# Patient Record
Sex: Male | Born: 2005
Health system: Southern US, Community
[De-identification: ages and names within clinical notes are randomized; demographics above are authoritative.]

---
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
## 2023-04-06 DIAGNOSIS — Z130 Encounter for screening for diseases of the blood and blood-forming organs and certain disorders involving the immune mechanism: Secondary | ICD-10-CM | POA: Diagnosis not present

## 2023-10-06 ENCOUNTER — Ambulatory Visit
Admission: RE | Admit: 2023-10-06 | Discharge: 2023-10-06 | Disposition: A | Discharge status: Home / Self Care | Payer: Self-pay | Source: Ambulatory Visit | Attending: Internal Medicine | Admitting: Internal Medicine

## 2023-10-06 ENCOUNTER — Other Ambulatory Visit: Payer: Self-pay

## 2023-10-06 VITALS — BP 118/79 | HR 89 | Temp 98.0°F | Resp 18

## 2023-10-06 DIAGNOSIS — Z202 Contact with and (suspected) exposure to infections with a predominantly sexual mode of transmission: Secondary | ICD-10-CM | POA: Diagnosis present

## 2023-10-06 DIAGNOSIS — Z113 Encounter for screening for infections with a predominantly sexual mode of transmission: Secondary | ICD-10-CM | POA: Diagnosis present

## 2023-10-06 MED ORDER — DOXYCYCLINE HYCLATE 100 MG PO CAPS: 100.0000 mg | ORAL_CAPSULE | Freq: Two times a day (BID) | ORAL | 0 refills | Status: AC

## 2023-10-06 NOTE — Discharge Instructions (Signed)
 Penile swab to test for STDs is pending.  I have sent you doxycycline given chlamydia exposure.

## 2023-10-06 NOTE — ED Triage Notes (Signed)
 Pt here for STD testing -swab only. Asymptomatic, but pt notes partner tested positive for chlamydia last week.

## 2023-10-06 NOTE — ED Provider Notes (Signed)
 [facility]    CSN: [id number] Arrival date & time: 10/06/23  1839      History   Chief Complaint Chief Complaint  Patient presents with   Exposure to STD    Entered by patient    HPI Christopher Love is a 18 y.o. male.   Patient presents today for STD testing after an exposure to chlamydia with a recent sexual partner approximately 3 weeks ago.  Patient denies any symptoms.   Exposure to STD    History reviewed. No pertinent past medical history.  There are no active problems to display for this patient.   History reviewed. No pertinent surgical history.     Home Medications    Prior to Admission medications   Medication Sig Start Date End Date Taking? Authorizing Provider  doxycycline (VIBRAMYCIN) 100 MG capsule Take 1 capsule (100 mg total) by mouth 2 (two) times daily for 7 days. 10/06/23 10/13/23 Yes Gustavus Bryant, FNP    Family History History reviewed. No pertinent family history.  Social History Social History   Tobacco Use   Smoking status: Never   Smokeless tobacco: Never  Vaping Use   Vaping status: Never Used  Substance Use Topics   Alcohol use: Never   Drug use: Never     Allergies   Patient has no known allergies.   Review of Systems Review of Systems Per HPI  Physical Exam Triage Vital Signs ED Triage Vitals  Encounter Vitals Group     BP 10/06/23 1854 118/79     Systolic BP Percentile --      Diastolic BP Percentile --      Pulse Rate 10/06/23 1854 89     Resp 10/06/23 1854 18     Temp 10/06/23 1854 98 F (36.7 C)     Temp Source 10/06/23 1854 Oral     SpO2 10/06/23 1854 98 %     Weight --      Height --      Head Circumference --      Peak Flow --      Pain Score 10/06/23 1855 0     Pain Loc --      Pain Education --      Exclude from Growth Chart --    No data found.  Updated Vital Signs BP 118/79 (BP Location: Left Arm)   Pulse 89   Temp 98 F (36.7 C) (Oral)   Resp 18   SpO2 98%   Visual  Acuity Right Eye Distance:   Left Eye Distance:   Bilateral Distance:    Right Eye Near:   Left Eye Near:    Bilateral Near:     Physical Exam Constitutional:      General: He is not in acute distress.    Appearance: Normal appearance. He is not toxic-appearing or diaphoretic.  HENT:     Head: Normocephalic and atraumatic.  Eyes:     Extraocular Movements: Extraocular movements intact.     Conjunctiva/sclera: Conjunctivae normal.  Pulmonary:     Effort: Pulmonary effort is normal.  Genitourinary:    Comments: Deferred with shared decision making.  Self swab performed. Neurological:     General: No focal deficit present.     Mental Status: He is alert and oriented to person, place, and time. Mental status is at baseline.  Psychiatric:        Mood and Affect: Mood normal.        Behavior: Behavior normal.  Thought Content: Thought content normal.        Judgment: Judgment normal.      UC Treatments / Results  Labs (all labs ordered are listed, but only abnormal results are displayed) Labs Reviewed  CYTOLOGY, (ORAL, ANAL, URETHRAL) ANCILLARY ONLY    EKG   Radiology No results found.  Procedures Procedures (including critical care time)  Medications Ordered in UC Medications - No data to display  Initial Impression / Assessment and Plan / UC Course  I have reviewed the triage vital signs and the nursing notes.  Pertinent labs & imaging results that were available during my care of the patient were reviewed by me and considered in my medical decision making (see chart for details).     Cytology swab pending.  Given confirmed exposure, will treat with doxycycline while awaiting results.  Advised safe sex practices and strict return precautions.  Patient verbalized understanding and was agreeable with plan. Final Clinical Impressions(s) / UC Diagnoses   Final diagnoses:  Screening examination for venereal disease  Exposure to chlamydia     Discharge  Instructions      Penile swab to test for STDs is pending.  I have sent you doxycycline given chlamydia exposure.    ED Prescriptions     Medication Sig Dispense Auth. Provider   doxycycline (VIBRAMYCIN) 100 MG capsule Take 1 capsule (100 mg total) by mouth 2 (two) times daily for 7 days. 14 capsule [place], Acie Fredrickson, [place]      PDMP not reviewed this encounter.   Gustavus Bryant, [place] 10/06/23 [phone]

## 2023-10-09 LAB — CYTOLOGY, (ORAL, ANAL, URETHRAL) ANCILLARY ONLY
Chlamydia: POSITIVE — AB
Comment: NEGATIVE
Comment: NEGATIVE
Comment: NORMAL
Neisseria Gonorrhea: NEGATIVE
Trichomonas: NEGATIVE

## 2023-10-11 ENCOUNTER — Other Ambulatory Visit: Payer: Self-pay

## 2023-10-11 ENCOUNTER — Ambulatory Visit
Admission: RE | Admit: 2023-10-11 | Discharge: 2023-10-11 | Disposition: A | Discharge status: Home / Self Care | Source: Ambulatory Visit | Attending: Physician Assistant | Admitting: Physician Assistant

## 2023-10-11 VITALS — BP 143/72 | HR 56 | Temp 98.7°F | Resp 18 | Ht 75.0 in | Wt 175.0 lb

## 2023-10-11 DIAGNOSIS — R112 Nausea with vomiting, unspecified: Secondary | ICD-10-CM | POA: Diagnosis not present

## 2023-10-11 DIAGNOSIS — R1084 Generalized abdominal pain: Secondary | ICD-10-CM

## 2023-10-11 MED ORDER — ONDANSETRON 4 MG PO TBDP: 4.0000 mg | ORAL_TABLET | Freq: Three times a day (TID) | ORAL | 0 refills | Status: AC | PRN

## 2023-10-11 NOTE — Discharge Instructions (Addendum)
 At this time I suspect that your nausea vomiting is likely secondary to your medication use.  Doxycycline is noted to have severe nausea and vomiting associated especially if taking on an empty stomach.  For now I recommend focusing on increasing your fluid intake and improving your nausea today.  If you are able to tolerate food and liquids without issue by the end of the day you can try taking your doxycycline tomorrow.  Try to take this in the middle of the meal to help reduce GI upset.  You can also try things such as Tums, Pepto-Bismol, Pepcid to assist with acid reflux and upset stomach.  I have sent in a medication called Zofran for you to take to assist with nausea and vomiting.  You can take this up to every 8 hours as needed.  If you are still unable to take the doxycycline despite eating and drinking and using the Zofran you can return to urgent care and we can discuss alternative medications.  If at any point you start to develop fever, choking, difficulty drinking and eating to the point refill you are becoming dehydrated please go to the emergency room for further evaluation and management. Please refrain from sexual activity until you have completed the doxycycline course.  Please use a condom or another barrier method to help prevent sexual transmitted disease transmission.

## 2023-10-11 NOTE — ED Triage Notes (Signed)
 Pt presents with complaints of nausea and vomiting since starting new antibiotic, Doxycyline, yesterday. Pt reports taking one dose of this medication. This is the first time taking. Pt currently rates his overall abdominal pain a 6/10. Denies taking OTC medications for symptoms reported.

## 2023-10-11 NOTE — ED Provider Notes (Signed)
 [facility]    CSN: [id number] Arrival date & time: 10/11/23  1147      History   Chief Complaint Chief Complaint  Patient presents with   Nausea    after the pills i've been throwing up non stop - Entered by patient   Emesis    HPI Christopher Love Christopher Love is a 18 y.o. male.   HPI  He reports he was exposed to STD and was treated with Doxycycline (testing results reviewed- positive for chlamydia on 10/06/23) He states he took his first dose yesterday AM and about 30 minutes later started to vomit He reports taking Doxycycline on empty stomach yesterday    He states he has been having recurrent vomiting since trying to take medication He has been able to tolerate drinking gatorade but has thrown up everything else   He denies hemoptysis    History reviewed. No pertinent past medical history.  There are no active problems to display for this patient.   History reviewed. No pertinent surgical history.     Home Medications    Prior to Admission medications   Medication Sig Start Date End Date Taking? Authorizing Provider  doxycycline (VIBRAMYCIN) 100 MG capsule Take 1 capsule (100 mg total) by mouth 2 (two) times daily for 7 days. 10/06/23 10/13/23 Yes [place], Acie Fredrickson, FNP  ondansetron (ZOFRAN-ODT) 4 MG disintegrating tablet Take 1 tablet (4 mg total) by mouth every 8 (eight) hours as needed for nausea or vomiting. 10/11/23  Yes Ceciley Buist, Oswaldo Conroy, PA-C    Family History History reviewed. No pertinent family history.  Social History Social History   Tobacco Use   Smoking status: Never   Smokeless tobacco: Never  Vaping Use   Vaping status: Never Used  Substance Use Topics   Alcohol use: Never   Drug use: Never     Allergies   Patient has no known allergies.   Review of Systems Review of Systems  Constitutional:  Negative for chills and fever.  Respiratory:  Negative for cough, choking and shortness of breath.   Gastrointestinal:  Positive for nausea  and vomiting.     Physical Exam Triage Vital Signs ED Triage Vitals  Encounter Vitals Group     BP 10/11/23 1159 (!) 143/72     Systolic BP Percentile --      Diastolic BP Percentile --      Pulse Rate 10/11/23 1159 (!) 56     Resp 10/11/23 1159 18     Temp 10/11/23 1159 98.7 F (37.1 C)     Temp Source 10/11/23 1159 Oral     SpO2 10/11/23 1159 98 %     Weight 10/11/23 1158 175 lb (79.4 kg)     Height 10/11/23 1158 6\' 3"  (1.905 m)     Head Circumference --      Peak Flow --      Pain Score 10/11/23 1158 6     Pain Loc --      Pain Education --      Exclude from Growth Chart --    No data found.  Updated Vital Signs BP (!) 143/72 (BP Location: Right Arm)   Pulse (!) 56   Temp 98.7 F (37.1 C) (Oral)   Resp 18   Ht 6\' 3"  (1.905 m)   Wt 175 lb (79.4 kg)   SpO2 98%   BMI 21.87 kg/m   Visual Acuity Right Eye Distance:   Left Eye Distance:   Bilateral Distance:  Right Eye Near:   Left Eye Near:    Bilateral Near:     Physical Exam Vitals reviewed.  Constitutional:      General: He is awake.     Appearance: Normal appearance. He is well-developed and well-groomed.  HENT:     Head: Normocephalic and atraumatic.     Mouth/Throat:     Lips: Pink.     Mouth: Mucous membranes are moist.  Pulmonary:     Effort: Pulmonary effort is normal.  Abdominal:     General: Abdomen is flat. Bowel sounds are normal.     Palpations: Abdomen is soft.     Tenderness: There is generalized abdominal tenderness.  Musculoskeletal:     Cervical back: Normal range of motion.  Neurological:     Mental Status: He is alert and oriented to person, place, and time.     GCS: GCS eye subscore is 4. GCS verbal subscore is 5. GCS motor subscore is 6.  Psychiatric:        Attention and Perception: Attention and perception normal.        Mood and Affect: Mood and affect normal.        Speech: Speech normal.        Behavior: Behavior normal. Behavior is cooperative.      UC  Treatments / Results  Labs (all labs ordered are listed, but only abnormal results are displayed) Labs Reviewed - No data to display  EKG   Radiology No results found.  Procedures Procedures (including critical care time)  Medications Ordered in UC Medications - No data to display  Initial Impression / Assessment and Plan / UC Course  I have reviewed the triage vital signs and the nursing notes.  Pertinent labs & imaging results that were available during my care of the patient were reviewed by me and considered in my medical decision making (see chart for details).      Final Clinical Impressions(s) / UC Diagnoses   Final diagnoses:  Nausea and vomiting, unspecified vomiting type  Generalized abdominal discomfort   Presents today with concerns for ongoing nausea and vomiting.  He reports trying to start a doxycycline regimen for chlamydia treatment yesterday and developed vomiting that has been persistent even today. He reports that he tried taking doxycycline without eating anything previously.  Reviewed that doxycycline is notoriously rough on the stomach and symptoms can likely be improved with taking with food and plenty of water.  At this time recommend over-the-counter medications such as Pepcid, Pepto-Bismol, Tums to assist with reflux and GI upset.  Will also send in Zofran to assist with nausea and vomiting.  Recommend bland diet as tolerated and increased hydration efforts throughout the day.  If he is able to tolerate p.o. intake without issue he can try restarting doxycycline course tomorrow.  Reviewed that if he is not able to tolerate doxycycline course we may have to explore alternative measures for which she would need to return to urgent care.  ED and return precautions reviewed and provided after visit summary.  Recommend refraining from sexual activity until he has completed an appropriate medication course.  Recommend using a condom or another barrier method to  help prevent sexually transmitted diseases.  Follow-up as needed for progressing or persistent symptoms    Discharge Instructions      At this time I suspect that your nausea vomiting is likely secondary to your medication use.  Doxycycline is noted to have severe nausea and vomiting associated  especially if taking on an empty stomach.  For now I recommend focusing on increasing your fluid intake and improving your nausea today.  If you are able to tolerate food and liquids without issue by the end of the day you can try taking your doxycycline tomorrow.  Try to take this in the middle of the meal to help reduce GI upset.  You can also try things such as Tums, Pepto-Bismol, Pepcid to assist with acid reflux and upset stomach.  I have sent in a medication called Zofran for you to take to assist with nausea and vomiting.  You can take this up to every 8 hours as needed.  If you are still unable to take the doxycycline despite eating and drinking and using the Zofran you can return to urgent care and we can discuss alternative medications.  If at any point you start to develop fever, choking, difficulty drinking and eating to the point refill you are becoming dehydrated please go to the emergency room for further evaluation and management. Please refrain from sexual activity until you have completed the doxycycline course.  Please use a condom or another barrier method to help prevent sexual transmitted disease transmission.     ED Prescriptions     Medication Sig Dispense Auth. Provider   ondansetron (ZOFRAN-ODT) 4 MG disintegrating tablet Take 1 tablet (4 mg total) by mouth every 8 (eight) hours as needed for nausea or vomiting. 20 tablet Santiago Graf E, PA-C      PDMP not reviewed this encounter.   Providence Crosby, PA-C 10/11/23 1217

## 2023-10-15 ENCOUNTER — Other Ambulatory Visit: Payer: Self-pay

## 2023-10-15 ENCOUNTER — Emergency Department (HOSPITAL_COMMUNITY)

## 2023-10-15 ENCOUNTER — Emergency Department (HOSPITAL_COMMUNITY)
Admission: EM | Admit: 2023-10-15 | Discharge: 2023-10-15 | Disposition: A | Discharge status: Home / Self Care | Attending: Emergency Medicine | Admitting: Emergency Medicine

## 2023-10-15 ENCOUNTER — Encounter (HOSPITAL_COMMUNITY): Payer: Self-pay | Admitting: Emergency Medicine

## 2023-10-15 DIAGNOSIS — R1084 Generalized abdominal pain: Secondary | ICD-10-CM | POA: Diagnosis not present

## 2023-10-15 DIAGNOSIS — R112 Nausea with vomiting, unspecified: Secondary | ICD-10-CM | POA: Diagnosis not present

## 2023-10-15 DIAGNOSIS — R109 Unspecified abdominal pain: Secondary | ICD-10-CM | POA: Diagnosis not present

## 2023-10-15 DIAGNOSIS — R1013 Epigastric pain: Secondary | ICD-10-CM | POA: Diagnosis present

## 2023-10-15 DIAGNOSIS — R1031 Right lower quadrant pain: Secondary | ICD-10-CM | POA: Diagnosis not present

## 2023-10-15 DIAGNOSIS — Z91010 Allergy to peanuts: Secondary | ICD-10-CM | POA: Diagnosis not present

## 2023-10-15 LAB — COMPREHENSIVE METABOLIC PANEL
ALT: 11 U/L (ref 0–44)
AST: 21 U/L (ref 15–41)
Albumin: 4.7 g/dL (ref 3.5–5.0)
Alkaline Phosphatase: 65 U/L (ref 38–126)
Anion gap: 13 (ref 5–15)
BUN: 11 mg/dL (ref 6–20)
CO2: 27 mmol/L (ref 22–32)
Calcium: 9.9 mg/dL (ref 8.9–10.3)
Chloride: 96 mmol/L — ABNORMAL LOW (ref 98–111)
Creatinine, Ser: 1.14 mg/dL (ref 0.61–1.24)
GFR, Estimated: >60 mL/min (ref >60)
Glucose, Bld: 103 mg/dL — ABNORMAL HIGH (ref 70–99)
Potassium: 3.5 mmol/L (ref 3.5–5.1)
Sodium: 136 mmol/L (ref 135–145)
Total Bilirubin: 4.4 mg/dL — ABNORMAL HIGH (ref 0.0–1.2)
Total Protein: 8 g/dL (ref 6.5–8.1)

## 2023-10-15 LAB — CBC
HCT: 45.9 % (ref 39.0–52.0)
Hemoglobin: 15.4 g/dL (ref 13.0–17.0)
MCH: 27.5 pg (ref 26.0–34.0)
MCHC: 33.6 g/dL (ref 30.0–36.0)
MCV: 82.1 fL (ref 80.0–100.0)
Platelets: 387 10*3/uL (ref 150–400)
RBC: 5.59 MIL/uL (ref 4.22–5.81)
RDW: 11.7 % (ref 11.5–15.5)
WBC: 8.7 10*3/uL (ref 4.0–10.5)
nRBC: 0 % (ref 0.0–0.2)

## 2023-10-15 LAB — URINALYSIS, ROUTINE W REFLEX MICROSCOPIC
Bacteria, UA: NONE SEEN
Bilirubin Urine: NEGATIVE
Glucose, UA: NEGATIVE mg/dL
Hgb urine dipstick: NEGATIVE
Ketones, ur: 20 mg/dL — AB
Leukocytes,Ua: NEGATIVE
Nitrite: NEGATIVE
Protein, ur: 30 mg/dL — AB
Specific Gravity, Urine: 1.031 — ABNORMAL HIGH (ref 1.005–1.030)
pH: 7 (ref 5.0–8.0)

## 2023-10-15 LAB — LIPASE, BLOOD: Lipase: 52 U/L — ABNORMAL HIGH (ref 11–51)

## 2023-10-15 MED ORDER — FAMOTIDINE 20 MG PO TABS
20.0000 mg | ORAL_TABLET | Freq: Once | ORAL | Status: AC
  Administered 2023-10-15: 20 mg via ORAL
  Filled 2023-10-15: qty 1

## 2023-10-15 MED ORDER — AZITHROMYCIN 250 MG PO TABS: 1000.0000 mg | ORAL_TABLET | Freq: Once | ORAL | 0 refills | Status: AC

## 2023-10-15 MED ORDER — ALUM & MAG HYDROXIDE-SIMETH 200-200-20 MG/5ML PO SUSP
30.0000 mL | Freq: Once | ORAL | Status: AC
  Administered 2023-10-15: 30 mL via ORAL
  Filled 2023-10-15: qty 30

## 2023-10-15 MED ORDER — ONDANSETRON 4 MG PO TBDP: 4.0000 mg | ORAL_TABLET | Freq: Three times a day (TID) | ORAL | 0 refills | Status: AC | PRN

## 2023-10-15 MED ORDER — IOHEXOL 350 MG/ML SOLN
75.0000 mL | Freq: Once | INTRAVENOUS | Status: AC | PRN
  Administered 2023-10-15: 75 mL via INTRAVENOUS

## 2023-10-15 NOTE — ED Provider Notes (Signed)
 [place] EMERGENCY DEPARTMENT AT [facility] Provider Note   CSN: [id number] Arrival date & time: 10/15/23  6578     History  Chief Complaint  Patient presents with   Abdominal Pain    Christopher Love is a 18 y.o. male presented to ED with abdominal pain.  Patient reports that his sexual partner was diagnosed with chlamydia approximately 1 week ago the patient himself tested positive a week ago.  He was prescribed doxycycline and took his first dose about a week ago, and then shortly after began having significant epigastric pain, nausea and vomiting.  He says he has had continued cramping pain, more persistently epigastric or right upper quadrant ever since then.  Pain is worse with eating.  He says he has not really moved his bowels in a few days which is unusual for him.  He typically has regular bowel movements.  He denies any known history of biliary disease, or abdominal surgical history.  He denies dysuria or penile discharge.  HPI     Home Medications Prior to Admission medications   Medication Sig Start Date End Date Taking? Authorizing Provider  azithromycin (ZITHROMAX) 250 MG tablet Take 4 tablets (1,000 mg total) by mouth once for 1 dose. Take first 2 tablets together, then 1 every day until finished. 10/15/23 10/15/23 Yes Elke Holtry, Kermit Balo, MD  ondansetron (ZOFRAN-ODT) 4 MG disintegrating tablet Take 1 tablet (4 mg total) by mouth every 8 (eight) hours as needed for up to 12 doses for nausea or vomiting. 10/15/23  Yes Terald Sleeper, MD  ondansetron (ZOFRAN-ODT) 4 MG disintegrating tablet Take 1 tablet (4 mg total) by mouth every 8 (eight) hours as needed for nausea or vomiting. 10/11/23   Mecum, Erin E, PA-C      Allergies    Peanut butter flavor [flavoring agent]    Review of Systems   Review of Systems  Physical Exam Updated Vital Signs BP 137/88   Pulse 69   Temp 98.4 F (36.9 C)   Resp 17   Ht 6\' 3"  (1.905 m)   Wt 79.4 kg   SpO2 100%   BMI  21.87 kg/m  Physical Exam Constitutional:      General: He is not in acute distress. HENT:     Head: Normocephalic and atraumatic.  Eyes:     Conjunctiva/sclera: Conjunctivae normal.     Pupils: Pupils are equal, round, and reactive to light.  Cardiovascular:     Rate and Rhythm: Normal rate and regular rhythm.  Pulmonary:     Effort: Pulmonary effort is normal. No respiratory distress.  Abdominal:     General: There is no distension.     Tenderness: There is abdominal tenderness in the right upper quadrant, right lower quadrant and epigastric area. There is rebound. There is no guarding. Negative signs include Murphy's sign.  Skin:    General: Skin is warm and dry.  Neurological:     General: No focal deficit present.     Mental Status: He is alert. Mental status is at baseline.  Psychiatric:        Mood and Affect: Mood normal.        Behavior: Behavior normal.     ED Results / Procedures / Treatments   Labs (all labs ordered are listed, but only abnormal results are displayed) Labs Reviewed  LIPASE, BLOOD - Abnormal; Notable for the following components:      Result Value   Lipase 52 (*)  All other components within normal limits  COMPREHENSIVE METABOLIC PANEL - Abnormal; Notable for the following components:   Chloride 96 (*)    Glucose, Bld 103 (*)    Total Bilirubin 4.4 (*)    All other components within normal limits  URINALYSIS, ROUTINE W REFLEX MICROSCOPIC - Abnormal; Notable for the following components:   Color, Urine AMBER (*)    Specific Gravity, Urine 1.031 (*)    Ketones, ur 20 (*)    Protein, ur 30 (*)    All other components within normal limits  CBC    EKG None  Radiology CT ABDOMEN PELVIS W CONTRAST Result Date: 10/15/2023 CLINICAL DATA:  RIGHT lower quadrant abdominal pain EXAM: CT ABDOMEN AND PELVIS WITH CONTRAST TECHNIQUE: Multidetector CT imaging of the abdomen and pelvis was performed using the standard protocol following bolus  administration of intravenous contrast. RADIATION DOSE REDUCTION: This exam was performed according to the departmental dose-optimization program which includes automated exposure control, adjustment of the mA and/or kV according to patient size and/or use of iterative reconstruction technique. CONTRAST:  75mL OMNIPAQUE IOHEXOL 350 MG/ML SOLN COMPARISON:  None Available. FINDINGS: Lower chest: Lung bases are clear. Hepatobiliary: No focal hepatic lesion. Fatty infiltration along the falciform ligament. Normal gallbladder. No biliary duct dilatation. Common bile duct is normal. Pancreas: Pancreas is normal. No ductal dilatation. No pancreatic inflammation. Spleen: Normal spleen Adrenals/urinary tract: Adrenal glands and kidneys are normal. The ureters and bladder normal. Stomach/Bowel: Stomach, small-bowel and cecum are normal. There is very little intra-abdominal fat which does make evaluation of the bowel and appendix difficult. Normal appendix is partially identified on coronal image 45/6. The colon and rectosigmoid colon are normal. Vascular/Lymphatic: Abdominal aorta is normal caliber. No periportal or retroperitoneal adenopathy. No pelvic adenopathy. Reproductive: Unremarkable Other: No free fluid. Musculoskeletal: No aggressive osseous lesion. IMPRESSION: 1. No acute findings in the abdomen pelvis. 2. Normal appendix partially identified. 3. Normal gallbladder. 4. Normal kidneys and ureters. Electronically Signed   By: Genevive Bi M.D.   On: 10/15/2023 12:46   US Abdomen Limited RUQ (LIVER/GB) Result Date: 10/15/2023 CLINICAL DATA:  [id number] Pain [id number]. EXAM: ULTRASOUND ABDOMEN LIMITED RIGHT UPPER QUADRANT COMPARISON:  None Available. FINDINGS: Gallbladder: No gallstones or wall thickening visualized. No sonographic Murphy sign noted by sonographer. Common bile duct: Diameter: Up to 4.3 mm.  No intrahepatic bile duct dilation. Liver: No focal lesion identified. Within normal limits in parenchymal  echogenicity. Portal vein is patent on color Doppler imaging with normal direction of blood flow towards the liver. Other: None. IMPRESSION: Unremarkable right upper quadrant ultrasound examination. Electronically Signed   By: Jules Schick M.D.   On: 10/15/2023 10:49    Procedures Procedures    Medications Ordered in ED Medications  alum & mag hydroxide-simeth (MAALOX/MYLANTA) 200-200-20 MG/5ML suspension 30 mL (30 mLs Oral Given 10/15/23 1111)  famotidine (PEPCID) tablet 20 mg (20 mg Oral Given 10/15/23 1111)  iohexol (OMNIPAQUE) 350 MG/ML injection 75 mL (75 mLs Intravenous Contrast Given 10/15/23 1229)    ED Course/ Medical Decision Making/ A&P                                 Medical Decision Making Amount and/or Complexity of Data Reviewed Labs: ordered. Radiology: ordered.  Risk OTC drugs. Prescription drug management.   This patient presents to the ED with concern for diffuse abdominal pain, nausea vomiting. This involves an extensive number of treatment options,  and is a complaint that carries with it a high risk of complications and morbidity.  The differential diagnosis includes gastritis versus medication reaction versus biliary colic or biliary disease versus pancreatitis versus appendicitis versus other  I ordered and personally interpreted labs.  The pertinent results include: No emergent findings.  There are some ketones and protein in some all on the concentration of the urine.  I ordered imaging studies including right upper quadrant ultrasound, CT abdomen pelvis I independently visualized and interpreted imaging which showed no emergent findings.  Appendix partially visualized but no inflammatory findings. I agree with the radiologist interpretation  I ordered medication including GI medications  I have reviewed the patients home medicines and have made adjustments as needed  After the interventions noted above, I reevaluated the patient and found that they  have: improved  Social Determinants of Health: patient counseled on sexual abstinence until recheck for chlamydia next week  Dispostion:  I discussed with the patient the fact that azithromycin is a second line medication and there may be increased resistance in this community to this medication with chlamydia.  However this is less likely to cause GI upset as it is a single dose given once with food.  The patient will have himself rechecked for chlamydia at the end of the week.  He is not tolerating doxycycline.  After consideration of the diagnostic results and the patients response to treatment, I feel that the patent would benefit from close outpatient follow-up         Final Clinical Impression(s) / ED Diagnoses Final diagnoses:  Generalized abdominal pain  Nausea and vomiting, unspecified vomiting type    Rx / DC Orders ED Discharge Orders          Ordered    azithromycin (ZITHROMAX) 250 MG tablet   Once        10/15/23 1406    ondansetron (ZOFRAN-ODT) 4 MG disintegrating tablet  Every 8 hours PRN        10/15/23 1406              Terald Sleeper, MD 10/15/23 1408

## 2023-10-15 NOTE — Discharge Instructions (Addendum)
 Please take the azithromycin antibiotic prescribed with food.  This is treatment for chlamydia.  It is a single dose of 1 gram in total.  Please have your self rechecked for chlamydia in 1 week at your doctor's office, or urgent care testing site.  Be aware that we are using a second line antibiotic to treat this because he did not tolerate doxycycline.  It is possible that your infection may be resistant to this antibiotic and you will need a different treatment.  That is why is important to have yourself rechecked.

## 2023-10-15 NOTE — ED Notes (Signed)
Awaiting patient from ultrasound

## 2023-10-15 NOTE — ED Notes (Signed)
 Patient transported to CT

## 2023-10-15 NOTE — ED Triage Notes (Signed)
 Pt reports epigastric/mid abdominal pain that started yesterday. Pt reports pain is worse after eating. Does report vomiting.

## 2023-10-15 NOTE — ED Provider Notes (Signed)
 Patient's pharmacy called to clarify dosing which I explained was 1 g to be taken all at once.  E-prescription had auto populated different instructions apparently.   Terald Sleeper, MD 10/15/23 [phone]
